# Patient Record
Sex: Male | Born: 1990 | Race: Black or African American | Hispanic: No | Marital: Single | Smoking: Current every day smoker
Health system: Southern US, Community
[De-identification: ages and names within clinical notes are randomized; demographics above are authoritative.]

---

## 2018-08-05 ENCOUNTER — Emergency Department
Admission: EM | Admit: 2018-08-05 | Discharge: 2018-08-06 | Disposition: A | Discharge status: Home / Self Care | Payer: Medicaid Other | Attending: Emergency Medicine | Admitting: Emergency Medicine

## 2018-08-05 ENCOUNTER — Other Ambulatory Visit: Payer: Self-pay

## 2018-08-05 DIAGNOSIS — R1084 Generalized abdominal pain: Secondary | ICD-10-CM | POA: Diagnosis not present

## 2018-08-05 DIAGNOSIS — K5900 Constipation, unspecified: Secondary | ICD-10-CM | POA: Diagnosis not present

## 2018-08-05 LAB — URINALYSIS, COMPLETE (UACMP) WITH MICROSCOPIC
Bacteria, UA: NONE SEEN
Bilirubin Urine: NEGATIVE
Glucose, UA: NEGATIVE mg/dL
Hgb urine dipstick: NEGATIVE
Ketones, ur: NEGATIVE mg/dL
Leukocytes, UA: NEGATIVE
Nitrite: NEGATIVE
Protein, ur: NEGATIVE mg/dL
Specific Gravity, Urine: 1.012 (ref 1.005–1.030)
pH: 7 (ref 5.0–8.0)

## 2018-08-05 LAB — CBC
HCT: 48.4 % (ref 39.0–52.0)
Hemoglobin: 16.2 g/dL (ref 13.0–17.0)
MCH: 31.2 pg (ref 26.0–34.0)
MCHC: 33.5 g/dL (ref 30.0–36.0)
MCV: 93.1 fL (ref 80.0–100.0)
Platelets: 210 10*3/uL (ref 150–400)
RBC: 5.2 MIL/uL (ref 4.22–5.81)
RDW: 11.7 % (ref 11.5–15.5)
WBC: 8.5 10*3/uL (ref 4.0–10.5)
nRBC: 0 % (ref 0.0–0.2)

## 2018-08-05 LAB — COMPREHENSIVE METABOLIC PANEL
ALT: 17 U/L (ref 0–44)
AST: 23 U/L (ref 15–41)
Albumin: 3.9 g/dL (ref 3.5–5.0)
Alkaline Phosphatase: 56 U/L (ref 38–126)
Anion gap: 5 (ref 5–15)
BUN: 11 mg/dL (ref 6–20)
CO2: 30 mmol/L (ref 22–32)
Calcium: 8.8 mg/dL — ABNORMAL LOW (ref 8.9–10.3)
Chloride: 105 mmol/L (ref 98–111)
Creatinine, Ser: 1.08 mg/dL (ref 0.61–1.24)
GFR calc Af Amer: >60 mL/min (ref >60)
GFR calc non Af Amer: >60 mL/min (ref >60)
Glucose, Bld: 96 mg/dL (ref 70–99)
Potassium: 3.8 mmol/L (ref 3.5–5.1)
Sodium: 140 mmol/L (ref 135–145)
Total Bilirubin: 0.6 mg/dL (ref 0.3–1.2)
Total Protein: 6.6 g/dL (ref 6.5–8.1)

## 2018-08-05 LAB — LIPASE, BLOOD: Lipase: 34 U/L (ref 11–51)

## 2018-08-05 NOTE — ED Triage Notes (Addendum)
Patient reports having abdominal pain for 3 days, denies nausea, vomiting or diarrhea.  Reports last BM earlier today.  States pain worse with movement.  Patient reports donated plasma on Wednesday.

## 2018-08-05 NOTE — ED Notes (Signed)
Pt states his bowel movement today was "constipated"

## 2018-08-06 ENCOUNTER — Emergency Department: Payer: Medicaid Other

## 2018-08-06 MED ORDER — LACTULOSE 10 GM/15ML PO SOLN
30.0000 g | Freq: Once | ORAL | Status: AC
  Administered 2018-08-06: 20 g via ORAL
  Filled 2018-08-06: qty 60

## 2018-08-06 MED ORDER — LACTULOSE 10 GM/15ML PO SOLN: 20.0000 g | Freq: Every day | ORAL | 0 refills | Status: AC | PRN

## 2018-08-06 NOTE — ED Provider Notes (Signed)
[facility] Emergency Department Provider Note   ____________________________________________   First MD Initiated Contact with Patient 08/05/18 2347     (approximate)  I have reviewed the triage vital signs and the nursing notes.   HISTORY  Chief Complaint Abdominal Pain    HPI Travis Browning is a 28 y.o. male who presents to the ED from home with a chief complaint of abdominal pain.  Patient reports a 3-day history of generalized abdominal pain not associated with eating, nausea, vomiting or diarrhea.  Feels constipated although he did have a bowel movement earlier but states it was a hard stool.  Also tells me he donated plasma Wednesday.  Denies associated fever, chills, chest pain, shortness of breath, vomiting, urinary retention, diarrhea.   Denies recent travel or trauma.   Past medical history None  There are no active problems to display for this patient.    Prior to Admission medications   Not on File    Allergies Patient has no known allergies.  No family history on file.  Social History Social History   Tobacco Use  . Smoking status: Not on file  Substance Use Topics  . Alcohol use: Not on file  . Drug use: Not on file  No recent EtOH  Review of Systems  Constitutional: No fever/chills Eyes: No visual changes. ENT: No sore throat. Cardiovascular: Denies chest pain. Respiratory: Denies shortness of breath. Gastrointestinal: Positive for abdominal pain.  No nausea, no vomiting.  No diarrhea.  No constipation. Genitourinary: Negative for dysuria. Musculoskeletal: Negative for back pain. Skin: Negative for rash. Neurological: Negative for headaches, focal weakness or numbness.   ____________________________________________   PHYSICAL EXAM:  VITAL SIGNS: ED Triage Vitals  Enc Vitals Group     BP 08/05/18 2246 (!) 136/97     Pulse Rate 08/05/18 2246 65     Resp 08/05/18 2246 18     Temp 08/05/18 2246 98.4 F  (36.9 C)     Temp Source 08/05/18 2246 Oral     SpO2 08/05/18 2246 100 %     Weight 08/05/18 2245 155 lb (70.3 kg)     Height 08/05/18 2245 5\' 8"  (1.727 m)     Head Circumference --      Peak Flow --      Pain Score 08/05/18 2245 0     Pain Loc --      Pain Edu? --      Excl. in GC? --     Constitutional: Asleep, awakened for exam.  Alert and oriented. Well appearing and in no acute distress. Eyes: Conjunctivae are normal. PERRL. EOMI. Head: Atraumatic. Nose: No congestion/rhinnorhea. Mouth/Throat: Mucous membranes are moist.  Oropharynx non-erythematous. Neck: No stridor.   Cardiovascular: Normal rate, regular rhythm. Grossly normal heart sounds.  Good peripheral circulation. Respiratory: Normal respiratory effort.  No retractions. Lungs CTAB. Gastrointestinal: Soft and minimally diffusely tender to palpation without rebound or guarding. No distention. No abdominal bruits. No CVA tenderness. Musculoskeletal: No lower extremity tenderness nor edema.  No joint effusions. Neurologic:  Normal speech and language. No gross focal neurologic deficits are appreciated. No gait instability. Skin:  Skin is warm, dry and intact. No rash noted. Psychiatric: Mood and affect are normal. Speech and behavior are normal.  ____________________________________________   LABS (all labs ordered are listed, but only abnormal results are displayed)  Labs Reviewed  COMPREHENSIVE METABOLIC PANEL - Abnormal; Notable for the following components:      Result Value   Calcium  8.8 (*)    All other components within normal limits  URINALYSIS, COMPLETE (UACMP) WITH MICROSCOPIC - Abnormal; Notable for the following components:   Color, Urine YELLOW (*)    APPearance CLEAR (*)    All other components within normal limits  LIPASE, BLOOD  CBC   ____________________________________________  EKG  None ____________________________________________  RADIOLOGY  ED MD interpretation: Moderate stool  burden  Official radiology report(s): Dg Abdomen 1 View  Result Date: 08/06/2018 CLINICAL DATA:  Abdominal pain for 3 days. EXAM: ABDOMEN - 1 VIEW COMPARISON:  None. FINDINGS: Gas and stool in the colon. No small or large bowel distention. No radiopaque stones. Visualized bones and soft tissue contours appear intact. IMPRESSION: Nonobstructive bowel gas pattern. Electronically Signed   By: Burman NievesWilliam  Stevens M.D.   On: 08/06/2018 00:45    ____________________________________________   PROCEDURES  Procedure(s) performed: None  Procedures  Critical Care performed: No  ____________________________________________   INITIAL IMPRESSION / ASSESSMENT AND PLAN / ED COURSE  As part of my medical decision making, I reviewed the following data within the electronic MEDICAL RECORD NUMBER Nursing notes reviewed and incorporated, Labs reviewed, Radiograph reviewed and Notes from prior ED visits    28 year old male who presents with a 3-day history of abdominal pain without associated fever, nausea, vomiting or diarrhea. Differential diagnosis includes, but is not limited to, acute appendicitis, renal colic, testicular torsion, urinary tract infection/pyelonephritis, prostatitis,  epididymitis, diverticulitis, small bowel obstruction or ileus, colitis, abdominal aortic aneurysm, gastroenteritis, hernia, etc.  Laboratory results reassuring.  Given patient's complaint of constipation, will obtain KUB.  Clinical Course as of Aug 07 47  Sat Aug 06, 2018  [id number] Updated patient on x-ray results.  Will discharge home on lactulose and recommend daily bowel regimen.  Strict return precautions given.  Patient verbalizes understanding and agrees with plan of care.   [JS]    Clinical Course User Index [JS] Irean HongSung, Jade J, MD     ____________________________________________   FINAL CLINICAL IMPRESSION(S) / ED DIAGNOSES  Final diagnoses:  Generalized abdominal pain  Constipation, unspecified constipation  type     ED Discharge Orders    None       Note:  This document was prepared using Dragon voice recognition software and may include unintentional dictation errors.    Irean HongSung, Jade J, MD 08/06/18 [phone]

## 2018-08-06 NOTE — ED Notes (Signed)
Pt has been walking up and down hall ways complaining to staff that he is ready to go and using foul language. Pt refused to sit down to have repeat vitals done before discharge.

## 2018-08-06 NOTE — Discharge Instructions (Addendum)
1.  You may take Lactulose as needed for bowel movements. 2.  I recommend the following over-the-counter medications to regulate daily bowel movements: MiraLAX Fiber Stool softeners 3.  Drink plenty of fluids daily. 4.  Return to the ER for worsening symptoms, persistent vomiting, difficulty breathing or other concerns.

## 2020-01-11 IMAGING — DX DG ABDOMEN 1V
1 series · 1 of 1 positions shown · non-contrast
Comparison: None.

CLINICAL DATA: Abdominal pain for 3 days.

EXAM:
ABDOMEN - 1 VIEW

[abdomen kub]
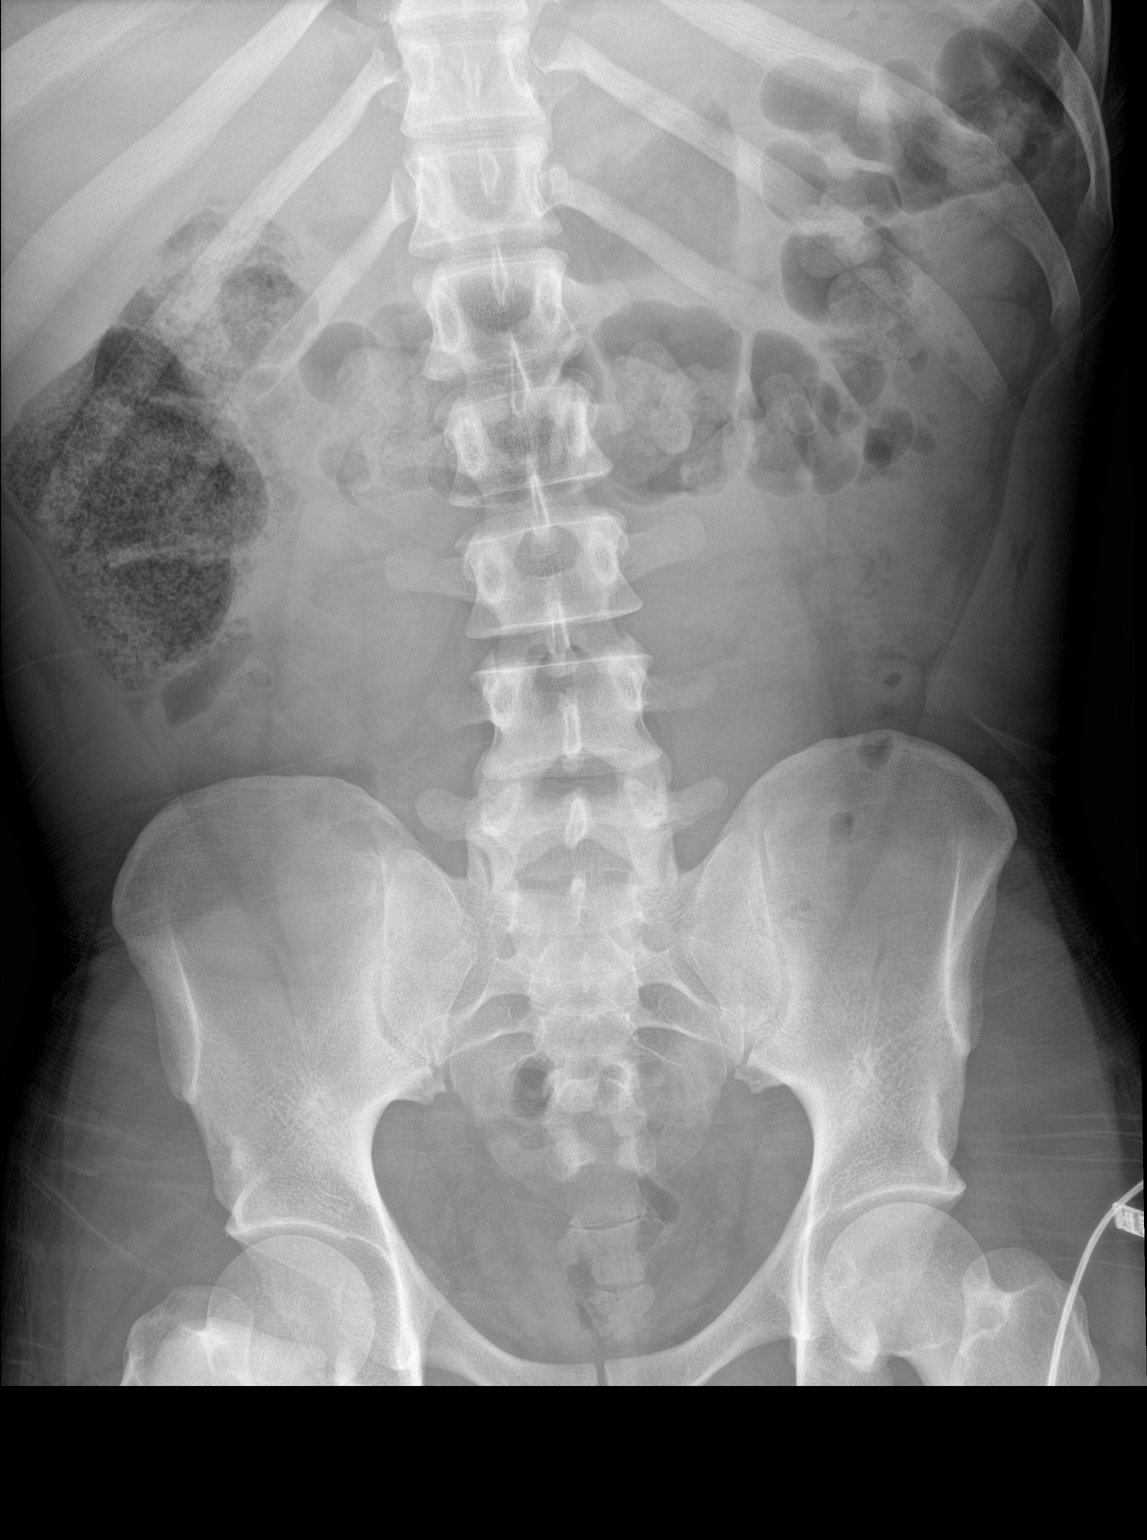

[1 of 1 positions shown; findings below may reference images not displayed]

FINDINGS: Gas and stool in the colon. No small or large bowel distention. No
radiopaque stones. Visualized bones and soft tissue contours appear
intact.
IMPRESSION: Nonobstructive bowel gas pattern.

## 2020-10-13 ENCOUNTER — Emergency Department: Payer: Medicaid Other

## 2020-10-13 ENCOUNTER — Other Ambulatory Visit: Payer: Self-pay

## 2020-10-13 ENCOUNTER — Emergency Department
Admission: EM | Admit: 2020-10-13 | Discharge: 2020-10-13 | Disposition: A | Discharge status: Home / Self Care | Payer: Medicaid Other | Attending: Emergency Medicine | Admitting: Emergency Medicine

## 2020-10-13 DIAGNOSIS — F17210 Nicotine dependence, cigarettes, uncomplicated: Secondary | ICD-10-CM | POA: Diagnosis not present

## 2020-10-13 DIAGNOSIS — R079 Chest pain, unspecified: Secondary | ICD-10-CM | POA: Diagnosis present

## 2020-10-13 DIAGNOSIS — R0789 Other chest pain: Secondary | ICD-10-CM | POA: Diagnosis not present

## 2020-10-13 DIAGNOSIS — F1910 Other psychoactive substance abuse, uncomplicated: Secondary | ICD-10-CM | POA: Insufficient documentation

## 2020-10-13 LAB — TROPONIN I (HIGH SENSITIVITY)
Troponin I (High Sensitivity): 5 ng/L (ref <18)
Troponin I (High Sensitivity): 6 ng/L (ref <18)

## 2020-10-13 LAB — CBC
HCT: 42.5 % (ref 39.0–52.0)
Hemoglobin: 14.4 g/dL (ref 13.0–17.0)
MCH: 31.4 pg (ref 26.0–34.0)
MCHC: 33.9 g/dL (ref 30.0–36.0)
MCV: 92.6 fL (ref 80.0–100.0)
Platelets: 244 10*3/uL (ref 150–400)
RBC: 4.59 MIL/uL (ref 4.22–5.81)
RDW: 11.8 % (ref 11.5–15.5)
WBC: 13.5 10*3/uL — ABNORMAL HIGH (ref 4.0–10.5)
nRBC: 0 % (ref 0.0–0.2)

## 2020-10-13 LAB — BASIC METABOLIC PANEL
Anion gap: 8 (ref 5–15)
BUN: 14 mg/dL (ref 6–20)
CO2: 27 mmol/L (ref 22–32)
Calcium: 8.5 mg/dL — ABNORMAL LOW (ref 8.9–10.3)
Chloride: 106 mmol/L (ref 98–111)
Creatinine, Ser: 1.13 mg/dL (ref 0.61–1.24)
GFR, Estimated: >60 mL/min (ref >60)
Glucose, Bld: 145 mg/dL — ABNORMAL HIGH (ref 70–99)
Potassium: 3.4 mmol/L — ABNORMAL LOW (ref 3.5–5.1)
Sodium: 141 mmol/L (ref 135–145)

## 2020-10-13 MED ORDER — POTASSIUM CHLORIDE CRYS ER 20 MEQ PO TBCR
40.0000 meq | EXTENDED_RELEASE_TABLET | Freq: Once | ORAL | Status: AC
  Administered 2020-10-13: 40 meq via ORAL
  Filled 2020-10-13: qty 2

## 2020-10-13 MED ORDER — IOHEXOL 350 MG/ML SOLN
100.0000 mL | Freq: Once | INTRAVENOUS | Status: AC | PRN
  Administered 2020-10-13: 100 mL via INTRAVENOUS

## 2020-10-13 MED ORDER — LORAZEPAM 2 MG/ML IJ SOLN
0.5000 mg | Freq: Once | INTRAMUSCULAR | Status: AC
  Administered 2020-10-13: 0.5 mg via INTRAVENOUS
  Filled 2020-10-13: qty 1

## 2020-10-13 MED ORDER — ACETAMINOPHEN 500 MG PO TABS
1000.0000 mg | ORAL_TABLET | Freq: Once | ORAL | Status: AC
  Administered 2020-10-13: 1000 mg via ORAL
  Filled 2020-10-13: qty 2

## 2020-10-13 NOTE — ED Triage Notes (Signed)
Pt presents to ER c/o left sided chest pain x3 days.  Pt reports regularly using  cocaine and had some tonight, along with marijuana use.  Pt describes pain as a tightness in his chest.  Pt denies any heart problems in past.

## 2020-10-13 NOTE — ED Notes (Signed)
1st RN note: per [facility]: CP pressure 10/10, chronic cocaine used "pt reports "not even a dime"; pt reports marijuana with ETOH use tonight

## 2020-10-13 NOTE — ED Notes (Signed)
Patient transported to X-ray 

## 2020-10-13 NOTE — Discharge Instructions (Addendum)
Your work-up was reassuring.  No evidence of heart attack or dissection on your CT scan.  However if you continue using cocaine it puts you at high risk for heart issues so please stop using it.  Take Tylenol 1 g every 8 hours to help with pain and ibuprofen 600 every 6 hours with food.  Return to the ER for worsening symptoms or any other concerns

## 2020-10-13 NOTE — ED Provider Notes (Signed)
[facility] Emergency Department Provider Note  ____________________________________________   Event Date/Time   First MD Initiated Contact with Patient 10/13/20 [phone]     (approximate)  I have reviewed the triage vital signs and the nursing notes.   HISTORY  Chief Complaint Chest Pain    HPI Travis Browning is a 30 y.o. male otherwise healthy comes in with chest pain.  Patient reportedly used marijuana, EtOH and cocaine.  The patient reports the pain has been there for 3 days, midsternal, constant, nothing makes it better, nothing makes it worse.  Currently it is moderate in nature.  He states that it started after using cocaine.  Says that he is still used cocaine but states he did not use as much today.  He also reports smoking marijuana and using some alcohol.  Denies any abdominal pain.  Denies any shortness of breath at this time.            History reviewed. No pertinent past medical history.  There are no problems to display for this patient.   History reviewed. No pertinent surgical history.  Prior to Admission medications   Medication Sig Start Date End Date Taking? Authorizing Provider  lactulose (CHRONULAC) 10 GM/15ML solution Take 30 mLs (20 g total) by mouth daily as needed for mild constipation. 08/06/18   Irean Hong, MD    Allergies Patient has no known allergies.  History reviewed. No pertinent family history.  Social History Social History   Tobacco Use  . Smoking status: Current Every Day Smoker    Packs/day: 0.50    Types: Cigarettes  . Smokeless tobacco: Never Used  Substance Use Topics  . Alcohol use: Yes    Comment: every weekened   . Drug use: Yes    Types: Marijuana, Cocaine      Review of Systems Constitutional: No fever/chills Eyes: No visual changes. ENT: No sore throat. Cardiovascular: Positive chest pain Respiratory: Denies shortness of breath. Gastrointestinal: No abdominal pain.  No nausea, no  vomiting.  No diarrhea.  No constipation. Genitourinary: Negative for dysuria. Musculoskeletal: Negative for back pain. Skin: Negative for rash. Neurological: Negative for headaches, focal weakness or numbness. All other ROS negative ____________________________________________   PHYSICAL EXAM:  VITAL SIGNS: ED Triage Vitals  Enc Vitals Group     BP 10/13/20 0123 138/84     Pulse Rate 10/13/20 0123 (!) 107     Resp 10/13/20 0123 18     Temp 10/13/20 0123 98.8 F (37.1 C)     Temp Source 10/13/20 0123 Oral     SpO2 10/13/20 0123 96 %     Weight 10/13/20 0126 160 lb (72.6 kg)     Height 10/13/20 0126 5\' 8"  (1.727 m)     Head Circumference --      Peak Flow --      Pain Score 10/13/20 0125 9     Pain Loc --      Pain Edu? --      Excl. in GC? --     Constitutional: Alert and oriented. Well appearing and in no acute distress. Eyes: Conjunctivae are normal. EOMI. Head: Atraumatic. Nose: No congestion/rhinnorhea. Mouth/Throat: Mucous membranes are moist.   Neck: No stridor. Trachea Midline. FROM Cardiovascular: Normal rate, regular rhythm. Grossly normal heart sounds.  Good peripheral circulation.  No chest wall tenderness Respiratory: Normal respiratory effort.  No retractions. Lungs CTAB. Gastrointestinal: Soft and nontender. No distention. No abdominal bruits.  Musculoskeletal: No lower extremity tenderness  nor edema.  No joint effusions. Neurologic:  Normal speech and language. No gross focal neurologic deficits are appreciated.  Skin:  Skin is warm, dry and intact. No rash noted. Psychiatric: Mood and affect are normal. Speech and behavior are normal. GU: Deferred   ____________________________________________   LABS (all labs ordered are listed, but only abnormal results are displayed)  Labs Reviewed  BASIC METABOLIC PANEL - Abnormal; Notable for the following components:      Result Value   Potassium 3.4 (*)    Glucose, Bld 145 (*)    Calcium 8.5 (*)    All  other components within normal limits  CBC - Abnormal; Notable for the following components:   WBC 13.5 (*)    All other components within normal limits  TROPONIN I (HIGH SENSITIVITY)  TROPONIN I (HIGH SENSITIVITY)   ____________________________________________   ED ECG REPORT I, Concha Se, the attending physician, personally viewed and interpreted this ECG.  Normal sinus rate of 100, no ST elevation, no T wave inversions, normal intervals ____________________________________________  RADIOLOGY Vela Prose, personally viewed and evaluated these images (plain radiographs) as part of my medical decision making, as well as reviewing the written report by the radiologist.  ED MD interpretation: No pneumonia  Official radiology report(s): DG Chest 2 View  Result Date: 10/13/2020 CLINICAL DATA:  Chest pain EXAM: CHEST - 2 VIEW COMPARISON:  None. FINDINGS: The heart size and mediastinal contours are within normal limits. Both lungs are clear. The visualized skeletal structures are unremarkable. IMPRESSION: No active cardiopulmonary disease. Electronically Signed   By: Deatra Robinson M.D.   On: 10/13/2020 01:52    ____________________________________________   PROCEDURES  Procedure(s) performed (including Critical Care):  Procedures   ____________________________________________   INITIAL IMPRESSION / ASSESSMENT AND PLAN / ED COURSE   Travis Browning was evaluated in Emergency Department on 10/13/2020 for the symptoms described in the history of present illness. He was evaluated in the context of the global COVID-19 pandemic, which necessitated consideration that the patient might be at risk for infection with the SARS-CoV-2 virus that causes COVID-19. Institutional protocols and algorithms that pertain to the evaluation of patients at risk for COVID-19 are in a state of rapid change based on information released by regulatory bodies including the CDC and federal and state  organizations. These policies and algorithms were followed during the patient's care in the ED.    Most Likely DDx:  -MSK (atypical chest pain) but given patient did use cocaine consider the possibility of ACS and will get cardiac markers and give some IV Ativan to help with pain.   DDx that was also considered d/t potential to cause harm, but was found less likely based on history and physical (as detailed above): -PNA (no fevers, cough but CXR to evaluate) -PNX (reassured with equal b/l breath sounds, CXR to evaluate) -Symptomatic anemia (will get H&H) -Pulmonary embolism as no sob at rest, not pleuritic in nature, no hypoxia -Aortic Dissection-consider if pain is not getting better with Ativan -Pericarditis no rub on exam, EKG changes or hx to suggest dx -Tamponade (no notable SOB, tachycardic, hypotensive) -Esophageal rupture (no h/o diffuse vomitting/no crepitus)   3:45 AM when I went to reevaluate patient he had the blanket over his head and was asleep.  After waking him up he states that he was still having pain that was severe therefore will proceed with CT scan otherwise suspect that this is more likely related to his polysubstance abuse His  potassium was slightly low so I gave some oral repletion.  His white count is elevated but no evidence of infection and patient is afebrile.  5:33 AM cardiac markers are negative x2, CT scan is negative.  Repeat labs are reassuring.  Tachycardia is now resolved blood pressures are normal and patient is afebrile  5:47 AM reevaluated patient and he is still asleep.  After rubbing his shoulder he wakes up and states that he is feeling better.  At this time will discharge patient home  I discussed the provisional nature of ED diagnosis, the treatment so far, the ongoing plan of care, follow up appointments and return precautions with the patient and any family or support people present. They expressed understanding and agreed with the plan,  discharged home.  ____________________________________________   FINAL CLINICAL IMPRESSION(S) / ED DIAGNOSES   Final diagnoses:  Substance abuse (HCC)  Atypical chest pain     MEDICATIONS GIVEN DURING THIS VISIT:  Medications  acetaminophen (TYLENOL) tablet 1,000 mg (1,000 mg Oral Given 10/13/20 0258)  LORazepam (ATIVAN) injection 0.5 mg (0.5 mg Intravenous Given 10/13/20 0258)  potassium chloride SA (KLOR-CON) CR tablet 40 mEq (40 mEq Oral Given 10/13/20 0258)  iohexol (OMNIPAQUE) 350 MG/ML injection 100 mL (100 mLs Intravenous Contrast Given 10/13/20 0435)     ED Discharge Orders    None       Note:  This document was prepared using Dragon voice recognition software and may include unintentional dictation errors.   Concha Se, MD 10/13/20 [phone]

## 2020-10-13 NOTE — ED Notes (Signed)
Pt reports that his nose is hurting from doing cocaine.

## 2020-10-13 NOTE — ED Notes (Signed)
Pt transported to CT ?

## 2020-10-28 ENCOUNTER — Emergency Department: Payer: Medicaid Other

## 2020-10-28 ENCOUNTER — Other Ambulatory Visit: Payer: Self-pay

## 2020-10-28 ENCOUNTER — Emergency Department
Admission: EM | Admit: 2020-10-28 | Discharge: 2020-10-28 | Disposition: A | Discharge status: Home / Self Care | Payer: Medicaid Other | Attending: Emergency Medicine | Admitting: Emergency Medicine

## 2020-10-28 ENCOUNTER — Encounter: Payer: Self-pay | Admitting: Emergency Medicine

## 2020-10-28 DIAGNOSIS — S299XXA Unspecified injury of thorax, initial encounter: Secondary | ICD-10-CM | POA: Diagnosis present

## 2020-10-28 DIAGNOSIS — R0602 Shortness of breath: Secondary | ICD-10-CM | POA: Insufficient documentation

## 2020-10-28 DIAGNOSIS — Y92410 Unspecified street and highway as the place of occurrence of the external cause: Secondary | ICD-10-CM | POA: Insufficient documentation

## 2020-10-28 DIAGNOSIS — T148XXA Other injury of unspecified body region, initial encounter: Secondary | ICD-10-CM

## 2020-10-28 DIAGNOSIS — R091 Pleurisy: Secondary | ICD-10-CM | POA: Diagnosis not present

## 2020-10-28 DIAGNOSIS — F17210 Nicotine dependence, cigarettes, uncomplicated: Secondary | ICD-10-CM | POA: Insufficient documentation

## 2020-10-28 DIAGNOSIS — S29019A Strain of muscle and tendon of unspecified wall of thorax, initial encounter: Secondary | ICD-10-CM | POA: Insufficient documentation

## 2020-10-28 MED ORDER — MELOXICAM 15 MG PO TABS: 15.0000 mg | ORAL_TABLET | Freq: Every day | ORAL | 2 refills | Status: AC

## 2020-10-28 MED ORDER — METHOCARBAMOL 500 MG PO TABS: 500.0000 mg | ORAL_TABLET | Freq: Three times a day (TID) | ORAL | 0 refills | Status: AC
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
## 2020-12-22 ENCOUNTER — Emergency Department
Admission: EM | Admit: 2020-12-22 | Discharge: 2020-12-22 | Disposition: A | Discharge status: Home / Self Care | Payer: Medicaid Other | Attending: Emergency Medicine | Admitting: Emergency Medicine

## 2020-12-22 ENCOUNTER — Emergency Department: Payer: Medicaid Other

## 2020-12-22 ENCOUNTER — Encounter: Payer: Self-pay | Admitting: Emergency Medicine

## 2020-12-22 ENCOUNTER — Other Ambulatory Visit: Payer: Self-pay

## 2020-12-22 DIAGNOSIS — F1010 Alcohol abuse, uncomplicated: Secondary | ICD-10-CM | POA: Diagnosis not present

## 2020-12-22 DIAGNOSIS — R041 Hemorrhage from throat: Secondary | ICD-10-CM | POA: Insufficient documentation

## 2020-12-22 DIAGNOSIS — Z5321 Procedure and treatment not carried out due to patient leaving prior to being seen by health care provider: Secondary | ICD-10-CM | POA: Diagnosis not present

## 2020-12-22 DIAGNOSIS — F1490 Cocaine use, unspecified, uncomplicated: Secondary | ICD-10-CM | POA: Diagnosis not present

## 2020-12-22 DIAGNOSIS — R079 Chest pain, unspecified: Secondary | ICD-10-CM | POA: Diagnosis not present

## 2020-12-22 LAB — BASIC METABOLIC PANEL
Anion gap: 11 (ref 5–15)
BUN: 9 mg/dL (ref 6–20)
CO2: 21 mmol/L — ABNORMAL LOW (ref 22–32)
Calcium: 9.1 mg/dL (ref 8.9–10.3)
Chloride: 104 mmol/L (ref 98–111)
Creatinine, Ser: 1.24 mg/dL (ref 0.61–1.24)
GFR, Estimated: >60 mL/min (ref >60)
Glucose, Bld: 187 mg/dL — ABNORMAL HIGH (ref 70–99)
Potassium: 3.5 mmol/L (ref 3.5–5.1)
Sodium: 136 mmol/L (ref 135–145)

## 2020-12-22 LAB — URINE DRUG SCREEN, QUALITATIVE (ARMC ONLY)
Amphetamines, Ur Screen: NOT DETECTED
Barbiturates, Ur Screen: NOT DETECTED
Benzodiazepine, Ur Scrn: NOT DETECTED
Cannabinoid 50 Ng, Ur ~~LOC~~: POSITIVE — AB
Cocaine Metabolite,Ur ~~LOC~~: POSITIVE — AB
MDMA (Ecstasy)Ur Screen: NOT DETECTED
Methadone Scn, Ur: NOT DETECTED
Opiate, Ur Screen: NOT DETECTED
Phencyclidine (PCP) Ur S: NOT DETECTED
Tricyclic, Ur Screen: NOT DETECTED

## 2020-12-22 LAB — CBC
HCT: 44.8 % (ref 39.0–52.0)
Hemoglobin: 15.8 g/dL (ref 13.0–17.0)
MCH: 31.7 pg (ref 26.0–34.0)
MCHC: 35.3 g/dL (ref 30.0–36.0)
MCV: 90 fL (ref 80.0–100.0)
Platelets: 289 10*3/uL (ref 150–400)
RBC: 4.98 MIL/uL (ref 4.22–5.81)
RDW: 12 % (ref 11.5–15.5)
WBC: 13.4 10*3/uL — ABNORMAL HIGH (ref 4.0–10.5)
nRBC: 0 % (ref 0.0–0.2)

## 2020-12-22 LAB — ETHANOL: Alcohol, Ethyl (B): <10 mg/dL (ref <10)

## 2020-12-22 LAB — TROPONIN I (HIGH SENSITIVITY): Troponin I (High Sensitivity): 5 ng/L (ref <18)

## 2020-12-22 NOTE — ED Notes (Signed)
NO ANSWER WHEN CALLED FOR REPEAT TROPONIN AND VITAL SIGNS.

## 2020-12-22 NOTE — ED Provider Notes (Signed)
Triage EKG reviewed by me, compared with previous EKG from April.  I do not see evidence of STEMI. Suspect element of early repolarization   Sharyn Creamer, MD 12/22/20 [phone]

## 2020-12-22 NOTE — ED Triage Notes (Addendum)
Pt arrived via EMS with reports of using cocaine, marijuana, and alcohol today, pt states he started not feeling well, c/o chest pain and bleeding from the mouth. No bleeding noted at this time. Pt is alert and oriented x 4.    Pt seen in April for similar complaint.

## 2020-12-22 NOTE — ED Notes (Signed)
First rn note: per ems pt has been consuming cocaine and THC products today. Per ems pt "feels funny". Ems states stable vital signs and normal 12 lead ekg. Pt ambulatory around lobby.

## 2020-12-22 NOTE — ED Notes (Signed)
NO ANSWER WHEN CALLED PT X3 IN LOBBY. PT NO LONGER VISUALIZED IN LOBBY.

## 2021-08-02 ENCOUNTER — Emergency Department
Admission: EM | Admit: 2021-08-02 | Discharge: 2021-08-02 | Discharge status: Against Medical Advice | Payer: Medicaid Other | Attending: Emergency Medicine | Admitting: Emergency Medicine

## 2021-08-02 ENCOUNTER — Emergency Department: Payer: Medicaid Other

## 2021-08-02 DIAGNOSIS — R55 Syncope and collapse: Secondary | ICD-10-CM | POA: Diagnosis not present

## 2021-08-02 DIAGNOSIS — R569 Unspecified convulsions: Secondary | ICD-10-CM | POA: Diagnosis present

## 2021-08-02 DIAGNOSIS — R0789 Other chest pain: Secondary | ICD-10-CM | POA: Insufficient documentation

## 2021-08-02 DIAGNOSIS — R079 Chest pain, unspecified: Secondary | ICD-10-CM

## 2021-08-02 LAB — CBC WITH DIFFERENTIAL/PLATELET
Abs Immature Granulocytes: 0.21 10*3/uL — ABNORMAL HIGH (ref 0.00–0.07)
Basophils Absolute: 0.1 10*3/uL (ref 0.0–0.1)
Basophils Relative: 0 %
Eosinophils Absolute: 0 10*3/uL (ref 0.0–0.5)
Eosinophils Relative: 0 %
HCT: 44.9 % (ref 39.0–52.0)
Hemoglobin: 15.2 g/dL (ref 13.0–17.0)
Immature Granulocytes: 1 %
Lymphocytes Relative: 4 %
Lymphs Abs: 0.8 10*3/uL (ref 0.7–4.0)
MCH: 30.6 pg (ref 26.0–34.0)
MCHC: 33.9 g/dL (ref 30.0–36.0)
MCV: 90.3 fL (ref 80.0–100.0)
Monocytes Absolute: 1.3 10*3/uL — ABNORMAL HIGH (ref 0.1–1.0)
Monocytes Relative: 6 %
Neutro Abs: 18.3 10*3/uL — ABNORMAL HIGH (ref 1.7–7.7)
Neutrophils Relative %: 89 %
Platelets: 288 10*3/uL (ref 150–400)
RBC: 4.97 MIL/uL (ref 4.22–5.81)
RDW: 11.9 % (ref 11.5–15.5)
WBC: 20.6 10*3/uL — ABNORMAL HIGH (ref 4.0–10.5)
nRBC: 0 % (ref 0.0–0.2)

## 2021-08-02 LAB — TROPONIN I (HIGH SENSITIVITY): Troponin I (High Sensitivity): 7 ng/L (ref <18)

## 2021-08-02 MED ORDER — SODIUM CHLORIDE 0.9 % IV BOLUS
500.0000 mL | Freq: Once | INTRAVENOUS | Status: AC
  Administered 2021-08-02: 500 mL via INTRAVENOUS

## 2021-08-02 NOTE — ED Notes (Signed)
Pt signed printed AMA form since no topaz available. Pt given d/c paperwork. Pt has ride home.

## 2021-08-02 NOTE — ED Provider Notes (Signed)
[facility] Provider Note    Event Date/Time   First MD Initiated Contact with Patient 08/02/21 1623     (approximate)   History   Seizures   HPI  Travis Browning is a 31 y.o. male with no significant past medical history who comes to the ED after a seizure.  He reports that he was using cocaine this morning from about 10:00 AM to 2:00 PM.  He denies having a seizure history.  He was noted to be having a seizure with tongue biting and bleeding on EMS arrival.  Patient denies any headache vision changes paresthesias or motor weakness.  She does report some central chest tightness which is nonradiating.  No shortness of breath.  Not pleuritic.  Not exertional.  Denies any other complaints.     Physical Exam   Triage Vital Signs: ED Triage Vitals  Enc Vitals Group     BP 08/02/21 1532 136/75     Pulse Rate 08/02/21 1532 (!) 110     Resp 08/02/21 1532 18     Temp 08/02/21 1532 97.7 F (36.5 C)     Temp Source 08/02/21 1532 Oral     SpO2 08/02/21 1528 98 %     Weight --      Height --      Head Circumference --      Peak Flow --      Pain Score 08/02/21 1535 0     Pain Loc --      Pain Edu? --      Excl. in [place]? --     Most recent vital signs: Vitals:   08/02/21 1532 08/02/21 1730  BP: 136/75 121/71  Pulse: (!) 110 93  Resp: 18 16  Temp: 97.7 F (36.5 C)   SpO2: 98% 100%     General: Awake, no distress.  CV:  Good peripheral perfusion.  Normal pulses.  Normal heart sounds.  Regular rate and rhythm, heart rate of 90 Resp:  Normal effort.  Clear to auscultation bilaterally Abd:  No distention.  Soft and nontender Other:  PERRL, EOMI, no nystagmus.  Lucid and clinically sober.  Steady gait.   ED Results / Procedures / Treatments   Labs (all labs ordered are listed, but only abnormal results are displayed) Labs Reviewed  CBC WITH DIFFERENTIAL/PLATELET - Abnormal; Notable for the following components:      Result Value   WBC 20.6 (*)     Neutro Abs 18.3 (*)    Monocytes Absolute 1.3 (*)    Abs Immature Granulocytes 0.21 (*)    All other components within normal limits  BASIC METABOLIC PANEL  TROPONIN I (HIGH SENSITIVITY)     EKG  EMS EKG interpreted by me Normal sinus rhythm, rate of about 100.  Normal axis intervals QRS ST segments and T waves.  No ischemic changes.   RADIOLOGY Chest x-ray viewed and interpreted by me, appears unremarkable.  Radiology report reviewed.  CT scan of the head viewed interpreted by me, no obvious intracranial hemorrhage.  Radiology report reviewed    PROCEDURES:  Critical Care performed: No  Procedures   MEDICATIONS ORDERED IN ED: Medications  sodium chloride 0.9 % bolus 500 mL (500 mLs Intravenous New Bag/Given 08/02/21 1654)     IMPRESSION / MDM / ASSESSMENT AND PLAN / ED COURSE  I reviewed the triage vital signs and the nursing notes.  Differential diagnosis includes, but is not limited to, intracranial hemorrhage, pneumonia, pneumothorax, non-STEMI     Patient presents with a seizure after cocaine use.  Also reports some central chest pain described as tightness.  Overall is relatively comfortable, tolerating oral intake.  CT scan of the head and chest x-ray were okay.  Initial EKG performed by EMS is nonischemic.  Initial troponin is normal.  He has a leukocytosis of 20,000 which I think is induced by the cocaine itself.  I recommended the patient be observed in the emergency department for several hours for repeat troponin testing and symptom monitoring, but he declined.  He reported that he needed to leave with the person that brought him, and elected to leave the hospital [place].  He has medical decision-making capacity.      FINAL CLINICAL IMPRESSION(S) / ED DIAGNOSES   Final diagnoses:  Syncope, unspecified syncope type  Nonspecific chest pain     Rx / DC Orders   ED Discharge Orders     None         Note:  This document was prepared using Dragon voice recognition software and may include unintentional dictation errors.   Carrie Mew, MD 08/02/21 [phone]

## 2021-08-02 NOTE — Discharge Instructions (Signed)
Your chest x-ray and CT scan of the head were okay today.  Please return to the emergency room if you have any worsening chest pain or new concerns.

## 2021-08-02 NOTE — ED Notes (Signed)
Pt denies ever having been on seizure meds. States this is his 2nd seizure; states first was also after cocaine use.

## 2021-08-02 NOTE — ED Notes (Signed)
Pt leaving for CT.  

## 2021-08-02 NOTE — ED Notes (Signed)
Pt offered food but pt declined. Pt given drink. Visitor remains at bedside. Pt updated his mother over the phone.
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
## 2021-08-02 NOTE — ED Notes (Signed)
Pt left AMA. Pt walked steadily to lobby as declined wheelchair.
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
## 2023-01-07 IMAGING — CT CT HEAD W/O CM
4 series · 17 of 47 positions shown, 19 images · non-contrast
Comparison: None.

CLINICAL DATA: Syncope/presyncope, cerebrovascular cause suspected



[Series 2: head wo · axial · 0.46mm/px · z∈[+139,+264]mm · 7 of 35 slices shown, 9 images]
[im 5/35  brain]
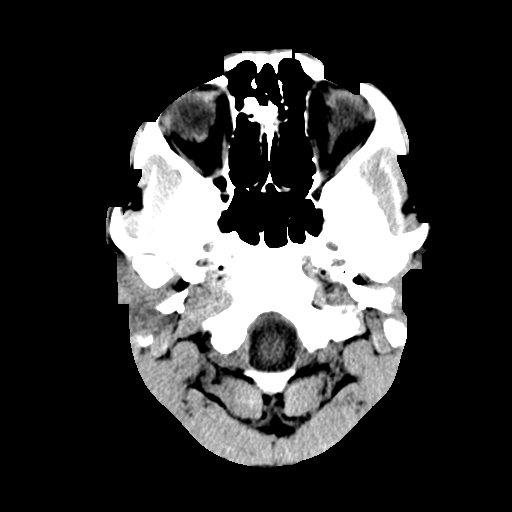
[im 5/35  bone]
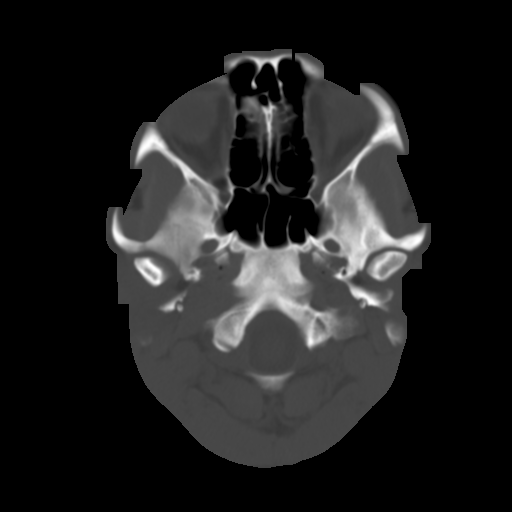
[im 9/35  brain]
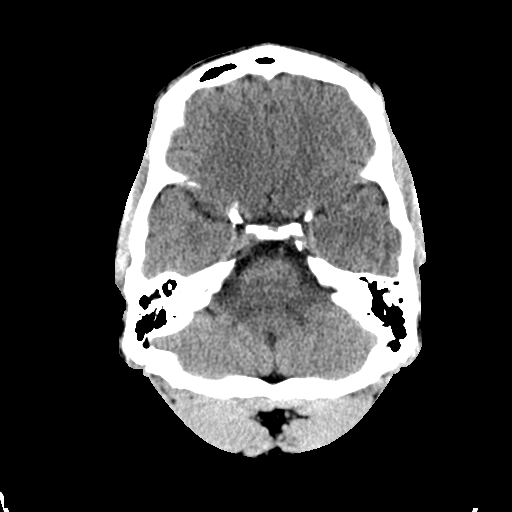
[im 13/35  brain]
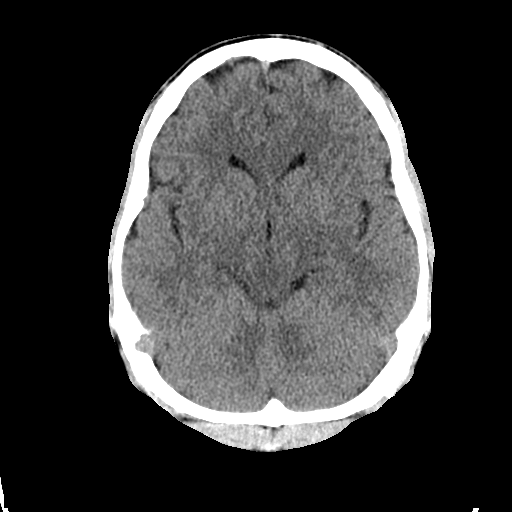
[im 18/35  brain]
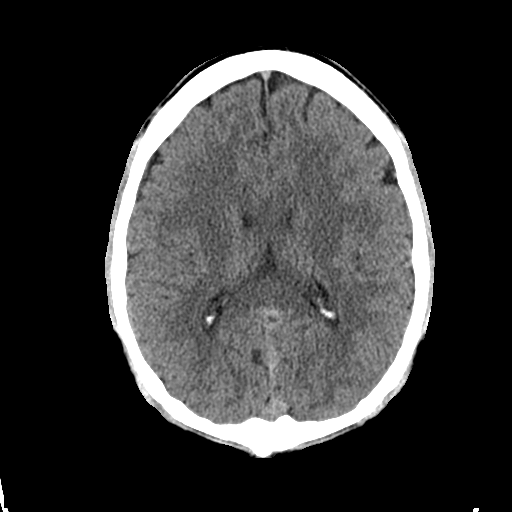
[im 22/35  brain]
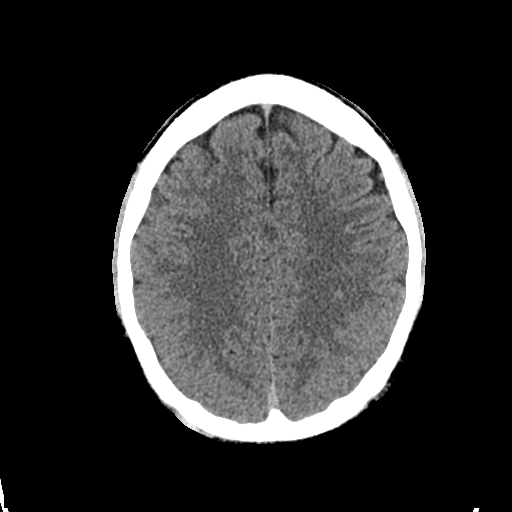
[im 22/35  bone]
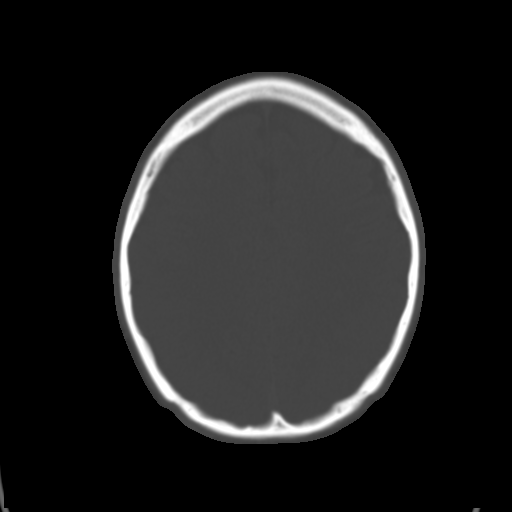
[im 26/35  brain]
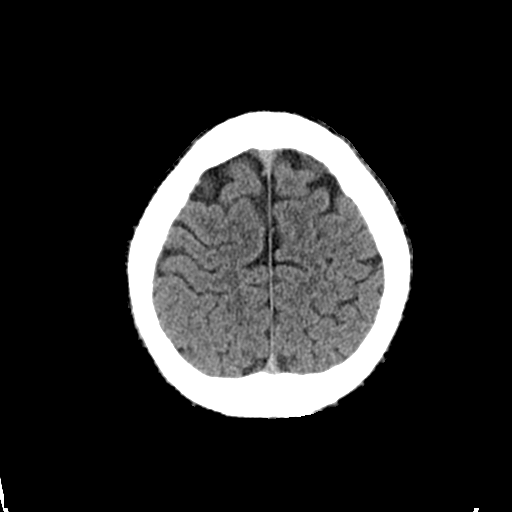
[im 30/35  brain]
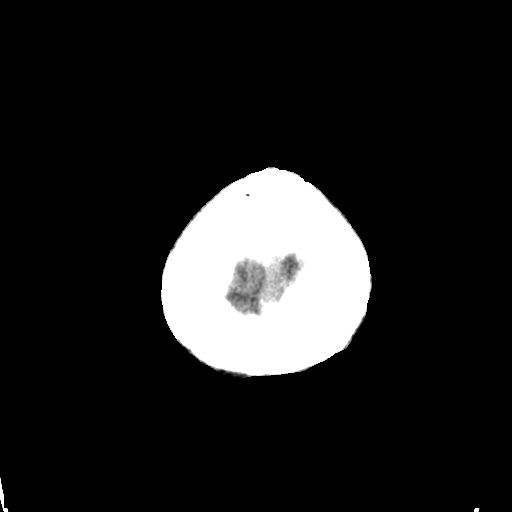

[Series 3: head bone · axial · 0.46mm/px · z∈[+135,+195]mm · 4 of 87 slices shown]
[im 9/87  bone]
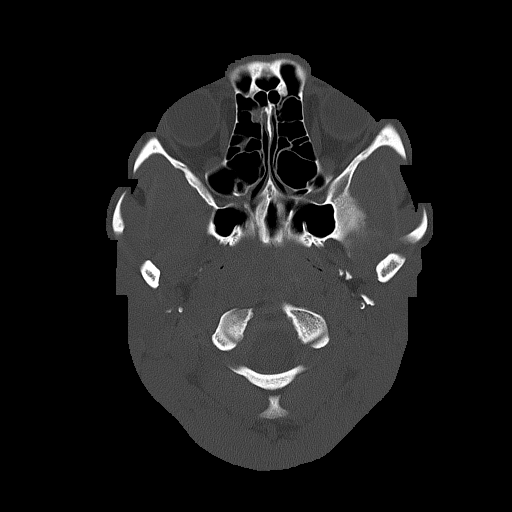
[im 18/87  bone]
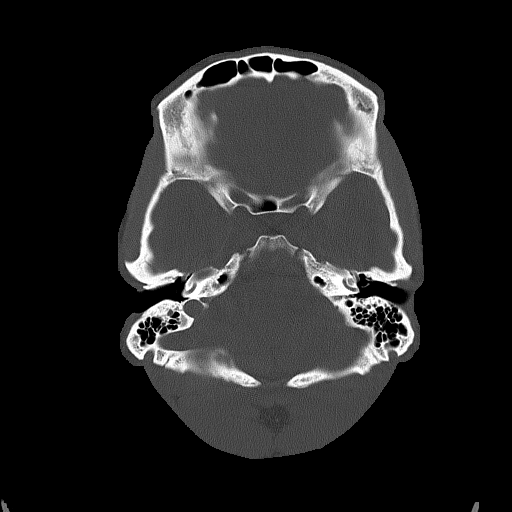
[im 26/87  bone]
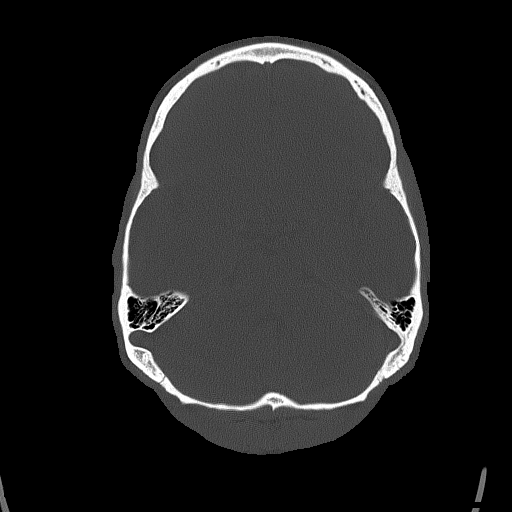
[im 39/87  bone]
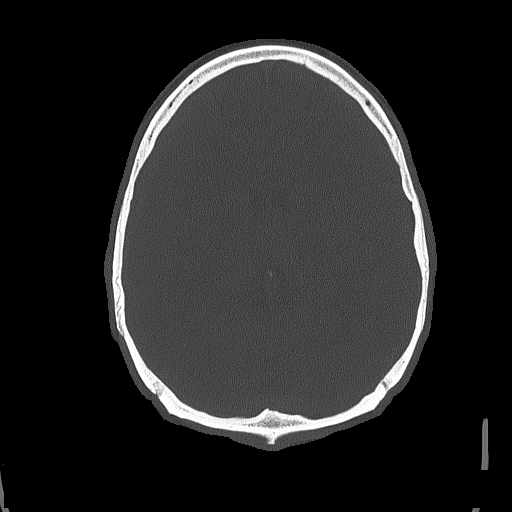

[Series 4: coronal soft tissue · coronal · 0.35mm/px · 3 of 74 slices shown]
[im 25/74  brain]
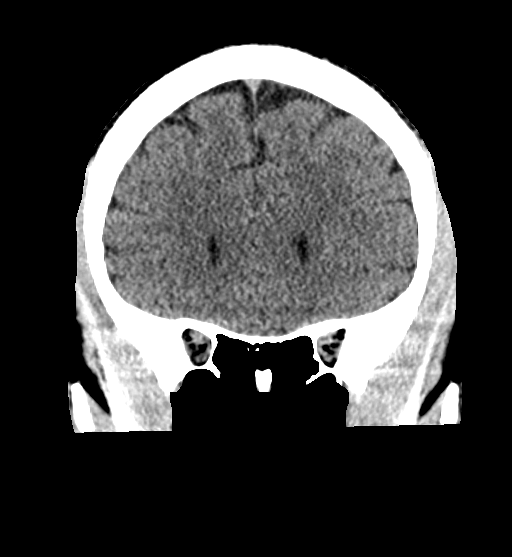
[im 33/74  brain]
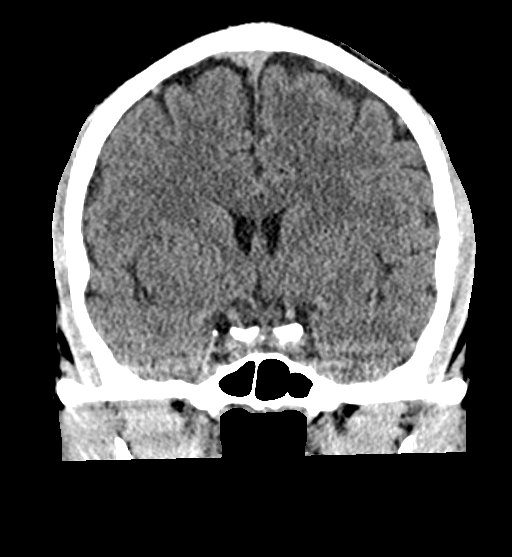
[im 41/74  brain]
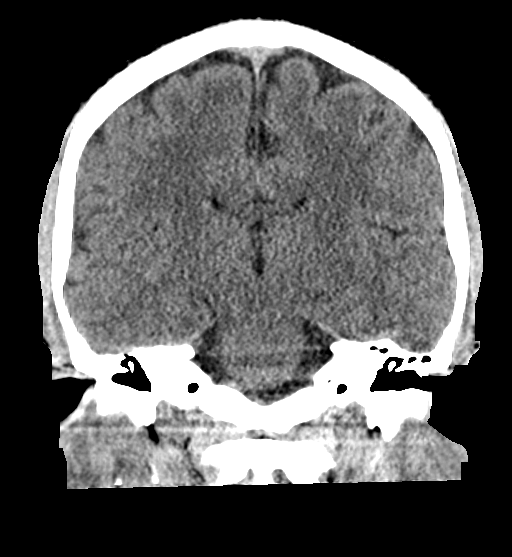

[Series 5: sagittal soft tissue · sagittal · 0.40mm/px · 3 of 61 slices shown]
[im 21/61  brain]
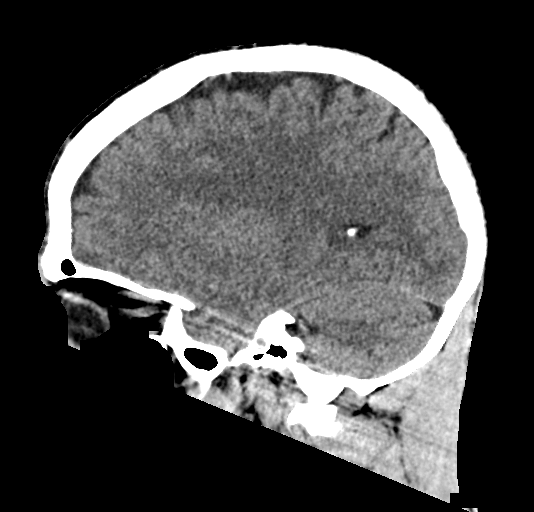
[im 31/61  brain]
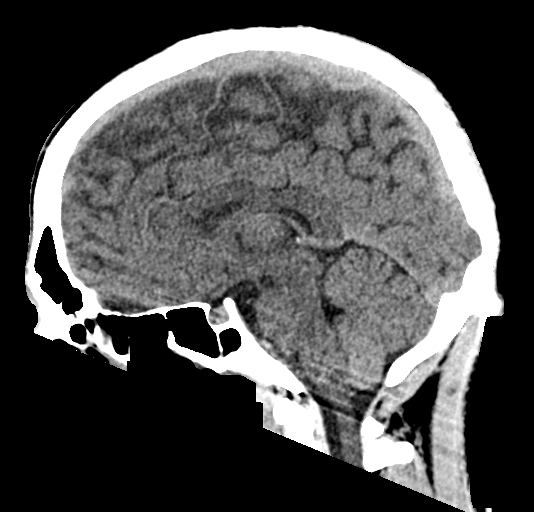
[im 41/61  brain]
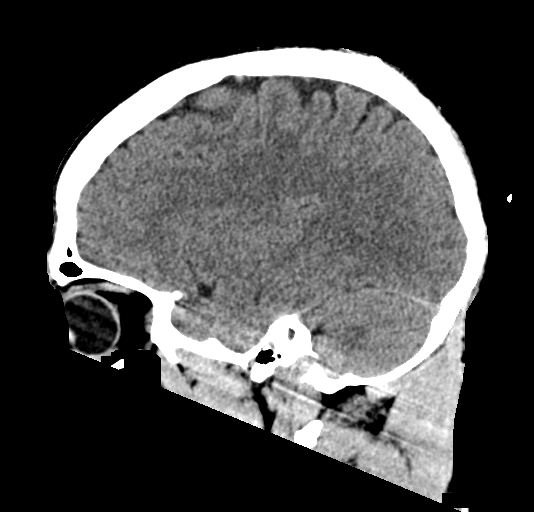

[17 of 47 positions shown; findings below may reference images not displayed]

FINDINGS: Brain: No evidence of acute infarction, hemorrhage, hydrocephalus,
extra-axial collection or mass lesion/mass effect.

Vascular: No hyperdense vessel or unexpected calcification.

Skull: Normal. Negative for fracture or focal lesion.

Sinuses/Orbits: Mild mucosal thickening of the LEFT maxillary
sinuses with scattered opacification of ethmoid air cells.

Other: None
IMPRESSION: No acute intracranial abnormality.
